# Patient Record
Sex: Male | Born: 1985 | Race: White | Hispanic: No | Marital: Single | Smoking: Current every day smoker
Health system: Southern US, Community
[De-identification: ages and names within clinical notes are randomized; demographics above are authoritative.]

## PROBLEM LIST (undated history)

## (undated) DIAGNOSIS — I10 Essential (primary) hypertension: Secondary | ICD-10-CM

## (undated) HISTORY — PX: HERNIA REPAIR: SHX51

---

## 2015-05-10 ENCOUNTER — Emergency Department (HOSPITAL_COMMUNITY)
Admission: EM | Admit: 2015-05-10 | Discharge: 2015-05-10 | Disposition: A | Discharge status: Home / Self Care | Payer: Self-pay | Attending: Physician Assistant | Admitting: Physician Assistant

## 2015-05-10 ENCOUNTER — Emergency Department (HOSPITAL_COMMUNITY): Payer: Self-pay

## 2015-05-10 ENCOUNTER — Encounter (HOSPITAL_COMMUNITY): Payer: Self-pay | Admitting: Neurology

## 2015-05-10 DIAGNOSIS — Y998 Other external cause status: Secondary | ICD-10-CM | POA: Insufficient documentation

## 2015-05-10 DIAGNOSIS — S3992XA Unspecified injury of lower back, initial encounter: Secondary | ICD-10-CM | POA: Insufficient documentation

## 2015-05-10 DIAGNOSIS — Y92410 Unspecified street and highway as the place of occurrence of the external cause: Secondary | ICD-10-CM | POA: Insufficient documentation

## 2015-05-10 DIAGNOSIS — F17200 Nicotine dependence, unspecified, uncomplicated: Secondary | ICD-10-CM | POA: Insufficient documentation

## 2015-05-10 DIAGNOSIS — M5489 Other dorsalgia: Secondary | ICD-10-CM

## 2015-05-10 DIAGNOSIS — Y9389 Activity, other specified: Secondary | ICD-10-CM | POA: Insufficient documentation

## 2015-05-10 DIAGNOSIS — I10 Essential (primary) hypertension: Secondary | ICD-10-CM | POA: Insufficient documentation

## 2015-05-10 HISTORY — DX: Essential (primary) hypertension: I10

## 2015-05-10 MED ORDER — IBUPROFEN 800 MG PO TABS
800.0000 mg | ORAL_TABLET | Freq: Once | ORAL | Status: AC
  Administered 2015-05-10: 800 mg via ORAL
  Filled 2015-05-10: qty 1

## 2015-05-10 MED ORDER — IBUPROFEN 800 MG PO TABS: 800.0000 mg | ORAL_TABLET | Freq: Three times a day (TID) | ORAL | Status: DC

## 2015-05-10 MED ORDER — CYCLOBENZAPRINE HCL 10 MG PO TABS
10.0000 mg | ORAL_TABLET | Freq: Once | ORAL | Status: AC
  Administered 2015-05-10: 10 mg via ORAL
  Filled 2015-05-10: qty 1

## 2015-05-10 MED ORDER — CYCLOBENZAPRINE HCL 10 MG PO TABS: 10.0000 mg | ORAL_TABLET | Freq: Two times a day (BID) | ORAL | Status: DC | PRN

## 2015-05-10 NOTE — Discharge Instructions (Signed)
Please follow up with your PCP and orthopedist.   Back Exercises The following exercises strengthen the muscles that help to support the back. They also help to keep the lower back flexible. Doing these exercises can help to prevent back pain or lessen existing pain. If you have back pain or discomfort, try doing these exercises 2-3 times each day or as told by your health care provider. When the pain goes away, do them once each day, but increase the number of times that you repeat the steps for each exercise (do more repetitions). If you do not have back pain or discomfort, do these exercises once each day or as told by your health care provider. EXERCISES Single Knee to Chest Repeat these steps 3-5 times for each leg:  Lie on your back on a firm bed or the floor with your legs extended.  Bring one knee to your chest. Your other leg should stay extended and in contact with the floor.  Hold your knee in place by grabbing your knee or thigh.  Pull on your knee until you feel a gentle stretch in your lower back.  Hold the stretch for 10-30 seconds.  Slowly release and straighten your leg. Pelvic Tilt Repeat these steps 5-10 times:  Lie on your back on a firm bed or the floor with your legs extended.  Bend your knees so they are pointing toward the ceiling and your feet are flat on the floor.  Tighten your lower abdominal muscles to press your lower back against the floor. This motion will tilt your pelvis so your tailbone points up toward the ceiling instead of pointing to your feet or the floor.  With gentle tension and even breathing, hold this position for 5-10 seconds. Cat-Cow Repeat these steps until your lower back becomes more flexible:  Get into a hands-and-knees position on a firm surface. Keep your hands under your shoulders, and keep your knees under your hips. You may place padding under your knees for comfort.  Let your head hang down, and point your tailbone toward the  floor so your lower back becomes rounded like the back of a cat.  Hold this position for 5 seconds.  Slowly lift your head and point your tailbone up toward the ceiling so your back forms a sagging arch like the back of a cow.  Hold this position for 5 seconds. Press-Ups Repeat these steps 5-10 times:  Lie on your abdomen (face-down) on the floor.  Place your palms near your head, about shoulder-width apart.  While you keep your back as relaxed as possible and keep your hips on the floor, slowly straighten your arms to raise the top half of your body and lift your shoulders. Do not use your back muscles to raise your upper torso. You may adjust the placement of your hands to make yourself more comfortable.  Hold this position for 5 seconds while you keep your back relaxed.  Slowly return to lying flat on the floor. Bridges Repeat these steps 10 times: 1. Lie on your back on a firm surface. 2. Bend your knees so they are pointing toward the ceiling and your feet are flat on the floor. 3. Tighten your buttocks muscles and lift your buttocks off of the floor until your waist is at almost the same height as your knees. You should feel the muscles working in your buttocks and the back of your thighs. If you do not feel these muscles, slide your feet 1-2 inches farther  away from your buttocks. 4. Hold this position for 3-5 seconds. 5. Slowly lower your hips to the starting position, and allow your buttocks muscles to relax completely. If this exercise is too easy, try doing it with your arms crossed over your chest. Abdominal Crunches Repeat these steps 5-10 times: 1. Lie on your back on a firm bed or the floor with your legs extended. 2. Bend your knees so they are pointing toward the ceiling and your feet are flat on the floor. 3. Cross your arms over your chest. 4. Tip your chin slightly toward your chest without bending your neck. 5. Tighten your abdominal muscles and slowly raise your  trunk (torso) high enough to lift your shoulder blades a tiny bit off of the floor. Avoid raising your torso higher than that, because it can put too much stress on your low back and it does not help to strengthen your abdominal muscles. 6. Slowly return to your starting position. Back Lifts Repeat these steps 5-10 times: 1. Lie on your abdomen (face-down) with your arms at your sides, and rest your forehead on the floor. 2. Tighten the muscles in your legs and your buttocks. 3. Slowly lift your chest off of the floor while you keep your hips pressed to the floor. Keep the back of your head in line with the curve in your back. Your eyes should be looking at the floor. 4. Hold this position for 3-5 seconds. 5. Slowly return to your starting position. SEEK MEDICAL CARE IF:  Your back pain or discomfort gets much worse when you do an exercise.  Your back pain or discomfort does not lessen within 2 hours after you exercise. If you have any of these problems, stop doing these exercises right away. Do not do them again unless your health care provider says that you can. SEEK IMMEDIATE MEDICAL CARE IF:  You develop sudden, severe back pain. If this happens, stop doing the exercises right away. Do not do them again unless your health care provider says that you can.   This information is not intended to replace advice given to you by your health care provider. Make sure you discuss any questions you have with your health care provider.   Document Released: 05/31/2004 Document Revised: 01/12/2015 Document Reviewed: 06/17/2014 Elsevier Interactive Patient Education 2016 [employer]  Back Pain, Adult Back pain is very common in adults.The cause of back pain is rarely dangerous and the pain often gets better over time.The cause of your back pain may not be known. Some common causes of back pain include:  Strain of the muscles or ligaments supporting the spine.  Wear and tear (degeneration) of  the spinal disks.  Arthritis.  Direct injury to the back. For many people, back pain may return. Since back pain is rarely dangerous, most people can learn to manage this condition on their own. HOME CARE INSTRUCTIONS Watch your back pain for any changes. The following actions may help to lessen any discomfort you are feeling:  Remain active. It is stressful on your back to sit or stand in one place for long periods of time. Do not sit, drive, or stand in one place for more than 30 minutes at a time. Take short walks on even surfaces as soon as you are able.Try to increase the length of time you walk each day.  Exercise regularly as directed by your health care provider. Exercise helps your back heal faster. It also helps avoid future injury by keeping  your muscles strong and flexible.  Do not stay in bed.Resting more than 1-2 days can delay your recovery.  Pay attention to your body when you bend and lift. The most comfortable positions are those that put less stress on your recovering back. Always use proper lifting techniques, including:  Bending your knees.  Keeping the load close to your body.  Avoiding twisting.  Find a comfortable position to sleep. Use a firm mattress and lie on your side with your knees slightly bent. If you lie on your back, put a pillow under your knees.  Avoid feeling anxious or stressed.Stress increases muscle tension and can worsen back pain.It is important to recognize when you are anxious or stressed and learn ways to manage it, such as with exercise.  Take medicines only as directed by your health care provider. Over-the-counter medicines to reduce pain and inflammation are often the most helpful.Your health care provider may prescribe muscle relaxant drugs.These medicines help dull your pain so you can more quickly return to your normal activities and healthy exercise.  Apply ice to the injured area:  Put ice in a plastic bag.  Place a towel  between your skin and the bag.  Leave the ice on for 20 minutes, 2-3 times a day for the first 2-3 days. After that, ice and heat may be alternated to reduce pain and spasms.  Maintain a healthy weight. Excess weight puts extra stress on your back and makes it difficult to maintain good posture. SEEK MEDICAL CARE IF:  You have pain that is not relieved with rest or medicine.  You have increasing pain going down into the legs or buttocks.  You have pain that does not improve in one week.  You have night pain.  You lose weight.  You have a fever or chills. SEEK IMMEDIATE MEDICAL CARE IF:   You develop new bowel or bladder control problems.  You have unusual weakness or numbness in your arms or legs.  You develop nausea or vomiting.  You develop abdominal pain.  You feel faint.   This information is not intended to replace advice given to you by your health care provider. Make sure you discuss any questions you have with your health care provider.   Document Released: 04/23/2005 Document Revised: 05/14/2014 Document Reviewed: 08/25/2013 Elsevier Interactive Patient Education [employer]

## 2015-05-10 NOTE — ED Notes (Signed)
EDP at the bedside.  ?
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
## 2015-05-21 ENCOUNTER — Emergency Department (HOSPITAL_COMMUNITY)
Admission: EM | Admit: 2015-05-21 | Discharge: 2015-05-22 | Disposition: A | Discharge status: Home / Self Care | Payer: Self-pay | Attending: Emergency Medicine | Admitting: Emergency Medicine

## 2015-05-21 ENCOUNTER — Encounter (HOSPITAL_COMMUNITY): Payer: Self-pay | Admitting: Emergency Medicine

## 2015-05-21 DIAGNOSIS — Z791 Long term (current) use of non-steroidal anti-inflammatories (NSAID): Secondary | ICD-10-CM | POA: Insufficient documentation

## 2015-05-21 DIAGNOSIS — G8929 Other chronic pain: Secondary | ICD-10-CM | POA: Insufficient documentation

## 2015-05-21 DIAGNOSIS — Z87448 Personal history of other diseases of urinary system: Secondary | ICD-10-CM | POA: Insufficient documentation

## 2015-05-21 DIAGNOSIS — I10 Essential (primary) hypertension: Secondary | ICD-10-CM | POA: Insufficient documentation

## 2015-05-21 DIAGNOSIS — F17200 Nicotine dependence, unspecified, uncomplicated: Secondary | ICD-10-CM | POA: Insufficient documentation

## 2015-05-21 DIAGNOSIS — R1013 Epigastric pain: Secondary | ICD-10-CM | POA: Insufficient documentation

## 2015-05-21 LAB — LIPASE, BLOOD: LIPASE: 31 U/L (ref 11–51)

## 2015-05-21 LAB — COMPREHENSIVE METABOLIC PANEL
ALT: 20 U/L (ref 17–63)
AST: 30 U/L (ref 15–41)
Albumin: 3.8 g/dL (ref 3.5–5.0)
Alkaline Phosphatase: 66 U/L (ref 38–126)
Anion gap: 10 (ref 5–15)
BUN: 13 mg/dL (ref 6–20)
CHLORIDE: 107 mmol/L (ref 101–111)
CO2: 24 mmol/L (ref 22–32)
CREATININE: 0.95 mg/dL (ref 0.61–1.24)
Calcium: 9.1 mg/dL (ref 8.9–10.3)
GFR calc Af Amer: >60 mL/min (ref >60)
Glucose, Bld: 118 mg/dL — ABNORMAL HIGH (ref 65–99)
Potassium: 4.2 mmol/L (ref 3.5–5.1)
SODIUM: 141 mmol/L (ref 135–145)
Total Bilirubin: 1.3 mg/dL — ABNORMAL HIGH (ref 0.3–1.2)
Total Protein: 6.5 g/dL (ref 6.5–8.1)

## 2015-05-21 LAB — URINALYSIS, ROUTINE W REFLEX MICROSCOPIC
Bilirubin Urine: NEGATIVE
GLUCOSE, UA: NEGATIVE mg/dL
KETONES UR: NEGATIVE mg/dL
Leukocytes, UA: NEGATIVE
Nitrite: NEGATIVE
PROTEIN: NEGATIVE mg/dL
Specific Gravity, Urine: 1.027 (ref 1.005–1.030)
pH: 5 (ref 5.0–8.0)

## 2015-05-21 LAB — CBC
HCT: 46.8 % (ref 39.0–52.0)
Hemoglobin: 15.3 g/dL (ref 13.0–17.0)
MCH: 26.9 pg (ref 26.0–34.0)
MCHC: 32.7 g/dL (ref 30.0–36.0)
MCV: 82.2 fL (ref 78.0–100.0)
PLATELETS: 322 10*3/uL (ref 150–400)
RBC: 5.69 MIL/uL (ref 4.22–5.81)
RDW: 14.5 % (ref 11.5–15.5)
WBC: 10.4 10*3/uL (ref 4.0–10.5)

## 2015-05-21 LAB — URINE MICROSCOPIC-ADD ON
Bacteria, UA: NONE SEEN
Squamous Epithelial / LPF: NONE SEEN
WBC, UA: NONE SEEN WBC/hpf (ref 0–5)

## 2015-05-21 MED ORDER — OXYCODONE-ACETAMINOPHEN 5-325 MG PO TABS
ORAL_TABLET | ORAL | Status: AC
  Filled 2015-05-21: qty 1

## 2015-05-21 MED ORDER — OXYCODONE-ACETAMINOPHEN 5-325 MG PO TABS
1.0000 | ORAL_TABLET | Freq: Once | ORAL | Status: AC
  Administered 2015-05-21: 1 via ORAL
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
## 2015-05-21 NOTE — ED Notes (Signed)
Dr. Delo at bedside. 
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
## 2015-05-22 ENCOUNTER — Emergency Department (HOSPITAL_COMMUNITY): Payer: Self-pay

## 2015-05-22 ENCOUNTER — Encounter (HOSPITAL_COMMUNITY): Payer: Self-pay | Admitting: Radiology

## 2015-05-22 MED ORDER — IOHEXOL 300 MG/ML  SOLN
100.0000 mL | Freq: Once | INTRAMUSCULAR | Status: AC | PRN
  Administered 2015-05-22: 100 mL via INTRAVENOUS

## 2015-05-22 MED ORDER — TRAMADOL HCL 50 MG PO TABS: 50.0000 mg | ORAL_TABLET | Freq: Four times a day (QID) | ORAL | Status: DC | PRN

## 2015-05-22 NOTE — ED Notes (Signed)
Patient verbalized understanding of discharge instructions and denies any further needs or questions at this time. VS stable. Patient ambulatory with steady gait.  
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
## 2015-11-13 ENCOUNTER — Emergency Department
Admission: EM | Admit: 2015-11-13 | Discharge: 2015-11-14 | Disposition: A | Discharge status: Home / Self Care | Payer: Self-pay | Attending: Emergency Medicine | Admitting: Emergency Medicine

## 2015-11-13 DIAGNOSIS — I10 Essential (primary) hypertension: Secondary | ICD-10-CM | POA: Insufficient documentation

## 2015-11-13 DIAGNOSIS — M545 Low back pain: Secondary | ICD-10-CM | POA: Insufficient documentation

## 2015-11-13 DIAGNOSIS — R109 Unspecified abdominal pain: Secondary | ICD-10-CM

## 2015-11-13 DIAGNOSIS — R319 Hematuria, unspecified: Secondary | ICD-10-CM

## 2015-11-13 DIAGNOSIS — Z79899 Other long term (current) drug therapy: Secondary | ICD-10-CM | POA: Insufficient documentation

## 2015-11-13 DIAGNOSIS — F17200 Nicotine dependence, unspecified, uncomplicated: Secondary | ICD-10-CM | POA: Insufficient documentation

## 2015-11-13 LAB — CBC
HEMATOCRIT: 45.3 % (ref 40.0–52.0)
Hemoglobin: 14.9 g/dL (ref 13.0–18.0)
MCH: 27.2 pg (ref 26.0–34.0)
MCHC: 33 g/dL (ref 32.0–36.0)
MCV: 82.5 fL (ref 80.0–100.0)
Platelets: 245 10*3/uL (ref 150–440)
RBC: 5.48 MIL/uL (ref 4.40–5.90)
RDW: 15 % — AB (ref 11.5–14.5)
WBC: 8.4 10*3/uL (ref 3.8–10.6)

## 2015-11-13 NOTE — ED Notes (Signed)
Pt arrives to ED via [facility] for c/o hematuria and left-sided lower back pain with radiation into left flank. Pt states lower back pain started around 3am last night, with the 1st incidence of hematuria noticed about 1 hr PTA. Pt reports h/x of kidney stones as a teenager; also reports 1 episode of acute renal failure x4 yrs ago. Pt denies any pain, burning, or change in urinary habits; pt also denies any c/o fever, nausea, vomiting or diarrhea. Pt is A&Ox4, in NAD, with respirations even, regular and unlabored. EMS reports VS as: BP 180/88, HR 90, and 98% O2 on RA.

## 2015-11-14 ENCOUNTER — Emergency Department: Payer: Self-pay

## 2015-11-14 LAB — URINALYSIS COMPLETE WITH MICROSCOPIC (ARMC ONLY)
BILIRUBIN URINE: NEGATIVE
Glucose, UA: NEGATIVE mg/dL
Ketones, ur: NEGATIVE mg/dL
LEUKOCYTES UA: NEGATIVE
Nitrite: NEGATIVE
PH: 5 (ref 5.0–8.0)
PROTEIN: NEGATIVE mg/dL
Specific Gravity, Urine: 1.024 (ref 1.005–1.030)

## 2015-11-14 LAB — BASIC METABOLIC PANEL
Anion gap: 7 (ref 5–15)
BUN: 13 mg/dL (ref 6–20)
CALCIUM: 9 mg/dL (ref 8.9–10.3)
CO2: 26 mmol/L (ref 22–32)
CREATININE: 0.81 mg/dL (ref 0.61–1.24)
Chloride: 107 mmol/L (ref 101–111)
GFR calc non Af Amer: >60 mL/min (ref >60)
Glucose, Bld: 109 mg/dL — ABNORMAL HIGH (ref 65–99)
Potassium: 3.5 mmol/L (ref 3.5–5.1)
SODIUM: 140 mmol/L (ref 135–145)

## 2015-11-14 MED ORDER — IBUPROFEN 800 MG PO TABS: 800.0000 mg | ORAL_TABLET | Freq: Three times a day (TID) | ORAL | Status: AC | PRN

## 2015-11-14 MED ORDER — ONDANSETRON 4 MG PO TBDP: 4.0000 mg | ORAL_TABLET | Freq: Three times a day (TID) | ORAL | Status: AC | PRN

## 2015-11-14 MED ORDER — SODIUM CHLORIDE 0.9 % IV BOLUS (SEPSIS)
1000.0000 mL | Freq: Once | INTRAVENOUS | Status: AC
  Administered 2015-11-14: 1000 mL via INTRAVENOUS

## 2015-11-14 MED ORDER — ONDANSETRON HCL 4 MG/2ML IJ SOLN
4.0000 mg | Freq: Once | INTRAMUSCULAR | Status: AC
  Administered 2015-11-14: 4 mg via INTRAVENOUS
  Filled 2015-11-14: qty 2

## 2015-11-14 MED ORDER — OXYCODONE-ACETAMINOPHEN 5-325 MG PO TABS
1.0000 | ORAL_TABLET | Freq: Once | ORAL | Status: AC
  Administered 2015-11-14: 1 via ORAL
  Filled 2015-11-14: qty 1

## 2015-11-14 MED ORDER — HYDROMORPHONE HCL 1 MG/ML IJ SOLN
1.0000 mg | Freq: Once | INTRAMUSCULAR | Status: AC
  Administered 2015-11-14: 1 mg via INTRAVENOUS
  Filled 2015-11-14: qty 1

## 2015-11-14 MED ORDER — OXYCODONE-ACETAMINOPHEN 5-325 MG PO TABS: 1.0000 | ORAL_TABLET | ORAL | Status: AC | PRN

## 2015-11-14 NOTE — ED Notes (Signed)
Pt transported to CT via stretcher.  

## 2015-11-14 NOTE — Discharge Instructions (Signed)
1. You may take medicines as needed for pain and nausea (Motrin/Percocet/Zofran). 2. Drink plenty of bottled or filtered water daily. 3. Return to the ER for worsening symptoms, persistent vomiting, difficulty breathing or other concerns.  Flank Pain Flank pain refers to pain that is located on the side of the body between the upper abdomen and the back. The pain may occur over a short period of time (acute) or may be long-term or reoccurring (chronic). It may be mild or severe. Flank pain can be caused by many things. CAUSES  Some of the more common causes of flank pain include:  Muscle strains.   Muscle spasms.   A disease of your spine (vertebral disk disease).   A lung infection (pneumonia).   Fluid around your lungs (pulmonary edema).   A kidney infection.   Kidney stones.   A very painful skin rash caused by the chickenpox virus (shingles).   Gallbladder disease.  HOME CARE INSTRUCTIONS  Home care will depend on the cause of your pain. In general,  Rest as directed by your caregiver.  Drink enough fluids to keep your urine clear or pale yellow.  Only take over-the-counter or prescription medicines as directed by your caregiver. Some medicines may help relieve the pain.  Tell your caregiver about any changes in your pain.  Follow up with your caregiver as directed. SEEK IMMEDIATE MEDICAL CARE IF:   Your pain is not controlled with medicine.   You have new or worsening symptoms.  Your pain increases.   You have abdominal pain.   You have shortness of breath.   You have persistent nausea or vomiting.   You have swelling in your abdomen.   You feel faint or pass out.   You have blood in your urine.  You have a fever or persistent symptoms for more than 2-3 days.  You have a fever and your symptoms suddenly get worse. MAKE SURE YOU:   Understand these instructions.  Will watch your condition.  Will get help right away if you are not  doing well or get worse.   This information is not intended to replace advice given to you by your health care provider. Make sure you discuss any questions you have with your health care provider.   Document Released: 06/14/2005 Document Revised: 01/16/2012 Document Reviewed: 12/06/2011 Elsevier Interactive Patient Education 2016 [employer]  Hematuria, Adult Hematuria is blood in your urine. It can be caused by a bladder infection, kidney infection, prostate infection, kidney stone, or cancer of your urinary tract. Infections can usually be treated with medicine, and a kidney stone usually will pass through your urine. If neither of these is the cause of your hematuria, further workup to find out the reason may be needed. It is very important that you tell your health care provider about any blood you see in your urine, even if the blood stops without treatment or happens without causing pain. Blood in your urine that happens and then stops and then happens again can be a symptom of a very serious condition. Also, pain is not a symptom in the initial stages of many urinary cancers. HOME CARE INSTRUCTIONS   Drink lots of fluid, 3-4 quarts a day. If you have been diagnosed with an infection, cranberry juice is especially recommended, in addition to large amounts of water.  Avoid caffeine, tea, and carbonated beverages because they tend to irritate the bladder.  Avoid alcohol because it may irritate the prostate.  Take all medicines  as directed by your health care provider.  If you were prescribed an antibiotic medicine, finish it all even if you start to feel better.  If you have been diagnosed with a kidney stone, follow your health care provider's instructions regarding straining your urine to catch the stone.  Empty your bladder often. Avoid holding urine for long periods of time.  After a bowel movement, women should cleanse front to back. Use each tissue only once.  Empty your  bladder before and after sexual intercourse if you are a male. SEEK MEDICAL CARE IF:  You develop back pain.  You have a fever.  You have a feeling of sickness in your stomach (nausea) or vomiting.  Your symptoms are not better in 3 days. Return sooner if you are getting worse. SEEK IMMEDIATE MEDICAL CARE IF:   You develop severe vomiting and are unable to keep the medicine down.  You develop severe back or abdominal pain despite taking your medicines.  You begin passing a large amount of blood or clots in your urine.  You feel extremely weak or faint, or you pass out. MAKE SURE YOU:   Understand these instructions.  Will watch your condition.  Will get help right away if you are not doing well or get worse.   This information is not intended to replace advice given to you by your health care provider. Make sure you discuss any questions you have with your health care provider.   Document Released: 04/23/2005 Document Revised: 05/14/2014 Document Reviewed: 12/22/2012 Elsevier Interactive Patient Education [employer] Inc[employer]

## 2015-11-14 NOTE — ED Provider Notes (Signed)
[facility] Emergency Department Provider Note   ____________________________________________  Time seen: Approximately 12:00 AM  I have reviewed the triage vital signs and the nursing notes.   HISTORY  Chief Complaint Hematuria and Flank Pain    HPI Erik Gould is a 30 y.o. male who presents to the ED from home via EMS with a chief complaint of left flank pain and hematuria. Reports onset of pain 3 AM last night, waxing/waning throughout the day today and increased approximately one hour prior to arrival. Symptoms associated with hematuria. Patient has a history of kidney stones a teenager and also reports an episode of renal failure 4 years ago secondary to NSAID use. Patient denies associated fever, chills, chest pain, shortness of breath, abdominal pain, testicular pain or swelling, nausea, vomiting or diarrhea. Denies recent travel trauma. Nothing makes his symptoms better or worse.   Past Medical History  Diagnosis Date  . Hypertension     There are no active problems to display for this patient.   Past Surgical History  Procedure Laterality Date  . Hernia repair      Current Outpatient Rx  Name  Route  Sig  Dispense  Refill  . atenolol (TENORMIN) 50 MG tablet   Oral   Take 50 mg by mouth daily.         Marland Kitchen ibuprofen (ADVIL,MOTRIN) 800 MG tablet   Oral   Take 1 tablet (800 mg total) by mouth every 8 (eight) hours as needed for moderate pain.   15 tablet   0   . ondansetron (ZOFRAN ODT) 4 MG disintegrating tablet   Oral   Take 1 tablet (4 mg total) by mouth every 8 (eight) hours as needed for nausea or vomiting.   20 tablet   0   . oxyCODONE-acetaminophen (ROXICET) 5-325 MG tablet   Oral   Take 1 tablet by mouth every 4 (four) hours as needed for severe pain.   20 tablet   0     Allergies Review of patient's allergies indicates no known allergies.  History reviewed. No pertinent family history.  Social History Social  History  Substance Use Topics  . Smoking status: Current Every Day Smoker  . Smokeless tobacco: None  . Alcohol Use: Yes    Review of Systems  Constitutional: No fever/chills. Eyes: No visual changes. ENT: No sore throat. Cardiovascular: Denies chest pain. Respiratory: Denies shortness of breath. Gastrointestinal: No abdominal pain.  No nausea, no vomiting.  No diarrhea.  No constipation. Genitourinary: Positive for hematuria. Negative for dysuria. Musculoskeletal: Positive for back pain. Skin: Negative for rash. Neurological: Negative for headaches, focal weakness or numbness.  10-point ROS otherwise negative.  ____________________________________________   PHYSICAL EXAM:  VITAL SIGNS: ED Triage Vitals  Enc Vitals Group     BP 11/13/15 2337 158/108 mmHg     Pulse Rate 11/13/15 2337 90     Resp 11/13/15 2337 16     Temp 11/13/15 2337 98 F (36.7 C)     Temp Source 11/13/15 2337 Oral     SpO2 11/13/15 2337 98 %     Weight 11/13/15 2337 270 lb (122.471 kg)     Height 11/13/15 2337  (1.803 m)     Head Cir --      Peak Flow --      Pain Score 11/13/15 2338 8     Pain Loc --      Pain Edu? --      Excl. in  GC? --     Constitutional: Alert and oriented. Well appearing and in mild acute distress. Eyes: Conjunctivae are normal. PERRL. EOMI. Head: Atraumatic. Nose: No congestion/rhinnorhea. Mouth/Throat: Mucous membranes are moist.  Oropharynx non-erythematous. Neck: No stridor.   Cardiovascular: Normal rate, regular rhythm. Grossly normal heart sounds.  Good peripheral circulation. Respiratory: Normal respiratory effort.  No retractions. Lungs CTAB. Gastrointestinal: Soft and nontender. No distention. No abdominal bruits. Mild left CVA tenderness. Musculoskeletal: No lower extremity tenderness nor edema.  No joint effusions. Neurologic:  Normal speech and language. No gross focal neurologic deficits are appreciated. No gait instability. Skin:  Skin is warm, dry  and intact. No rash noted. Psychiatric: Mood and affect are normal. Speech and behavior are normal.  ____________________________________________   LABS (all labs ordered are listed, but only abnormal results are displayed)  Labs Reviewed  URINALYSIS COMPLETEWITH MICROSCOPIC ([facility] ONLY) - Abnormal; Notable for the following:    Color, Urine YELLOW (*)    APPearance CLEAR (*)    Hgb urine dipstick 3+ (*)    Bacteria, UA RARE (*)    Squamous Epithelial / LPF 0-5 (*)    All other components within normal limits  CBC - Abnormal; Notable for the following:    RDW 15.0 (*)    All other components within normal limits  BASIC METABOLIC PANEL - Abnormal; Notable for the following:    Glucose, Bld 109 (*)    All other components within normal limits   ____________________________________________  EKG  None ____________________________________________  RADIOLOGY  CT Renal Stone Study interpreted per Dr. Gwenyth Benderadparvar: No acute intra-abdominal pelvic pathology. Specifically there is no hydronephrosis or nephrolithiasis ____________________________________________   PROCEDURES  Procedure(s) performed: None  Procedures  Critical Care performed: No  ____________________________________________   INITIAL IMPRESSION / ASSESSMENT AND PLAN / ED COURSE  Pertinent labs & imaging results that were available during my care of the patient were reviewed by me and considered in my medical decision making (see chart for details).  30 year old male who presents with left flank pain, history of kidney stones. Will obtain screening lab work, initiate IV fluid resuscitation, analgesia and obtain CT renal colic study.  ----------------------------------------- 2:20 AM on 11/14/2015 -----------------------------------------  Patient improved. Updated patient of laboratory and imaging results. Hematuria noted in urinalysis. Possible kidney stone either passing or too small to visualize on CT.  Will treat with analgesia and follow up with urology. Strict return precautions given. Patient verbalizes understanding and agrees with plan of care. ____________________________________________   FINAL CLINICAL IMPRESSION(S) / ED DIAGNOSES  Final diagnoses:  Flank pain  Hematuria      NEW MEDICATIONS STARTED DURING THIS VISIT:  New Prescriptions   IBUPROFEN (ADVIL,MOTRIN) 800 MG TABLET    Take 1 tablet (800 mg total) by mouth every 8 (eight) hours as needed for moderate pain.   ONDANSETRON (ZOFRAN ODT) 4 MG DISINTEGRATING TABLET    Take 1 tablet (4 mg total) by mouth every 8 (eight) hours as needed for nausea or vomiting.   OXYCODONE-ACETAMINOPHEN (ROXICET) 5-325 MG TABLET    Take 1 tablet by mouth every 4 (four) hours as needed for severe pain.     Note:  This document was prepared using Dragon voice recognition software and may include unintentional dictation errors.    Irean HongJade J Sung, MD 11/14/15 [id number]
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
